# Patient Record
Sex: Male | Born: 1990 | Race: White | Hispanic: No | Marital: Single | Smoking: Current every day smoker
Health system: Southern US, Community
[De-identification: ages and names within clinical notes are randomized; demographics above are authoritative.]

## PROBLEM LIST (undated history)

## (undated) DIAGNOSIS — R569 Unspecified convulsions: Secondary | ICD-10-CM

## (undated) HISTORY — PX: SHOULDER SURGERY: SHX246

## (undated) HISTORY — DX: Unspecified convulsions: R56.9

## (undated) HISTORY — PX: WISDOM TOOTH EXTRACTION: SHX21

---

## 2012-10-19 ENCOUNTER — Ambulatory Visit: Payer: Self-pay | Admitting: Sports Medicine

## 2014-11-12 ENCOUNTER — Emergency Department (HOSPITAL_COMMUNITY): Payer: Self-pay

## 2014-11-12 ENCOUNTER — Emergency Department (HOSPITAL_COMMUNITY)
Admission: EM | Admit: 2014-11-12 | Discharge: 2014-11-12 | Disposition: A | Discharge status: Home / Self Care | Payer: Self-pay | Attending: Emergency Medicine | Admitting: Emergency Medicine

## 2014-11-12 ENCOUNTER — Encounter (HOSPITAL_COMMUNITY): Payer: Self-pay | Admitting: Emergency Medicine

## 2014-11-12 DIAGNOSIS — R569 Unspecified convulsions: Secondary | ICD-10-CM | POA: Insufficient documentation

## 2014-11-12 DIAGNOSIS — Z720 Tobacco use: Secondary | ICD-10-CM | POA: Insufficient documentation

## 2014-11-12 DIAGNOSIS — Z79899 Other long term (current) drug therapy: Secondary | ICD-10-CM | POA: Insufficient documentation

## 2014-11-12 LAB — COMPREHENSIVE METABOLIC PANEL
ALT: 24 U/L (ref 0–53)
ANION GAP: 8 (ref 5–15)
AST: 34 U/L (ref 0–37)
Albumin: 4.5 g/dL (ref 3.5–5.2)
Alkaline Phosphatase: 83 U/L (ref 39–117)
BUN: 16 mg/dL (ref 6–23)
CHLORIDE: 103 meq/L (ref 96–112)
CO2: 25 mmol/L (ref 19–32)
Calcium: 8.8 mg/dL (ref 8.4–10.5)
Creatinine, Ser: 1.31 mg/dL (ref 0.50–1.35)
GFR calc Af Amer: 86 mL/min — ABNORMAL LOW (ref >90)
GFR calc non Af Amer: 75 mL/min — ABNORMAL LOW (ref >90)
Glucose, Bld: 100 mg/dL — ABNORMAL HIGH (ref 70–99)
POTASSIUM: 3.7 mmol/L (ref 3.5–5.1)
Sodium: 136 mmol/L (ref 135–145)
Total Bilirubin: 0.5 mg/dL (ref 0.3–1.2)
Total Protein: 6.9 g/dL (ref 6.0–8.3)

## 2014-11-12 LAB — URINALYSIS, ROUTINE W REFLEX MICROSCOPIC
Bilirubin Urine: NEGATIVE
Glucose, UA: NEGATIVE mg/dL
KETONES UR: NEGATIVE mg/dL
LEUKOCYTES UA: NEGATIVE
NITRITE: NEGATIVE
Protein, ur: NEGATIVE mg/dL
SPECIFIC GRAVITY, URINE: 1.02 (ref 1.005–1.030)
UROBILINOGEN UA: 0.2 mg/dL (ref 0.0–1.0)
pH: 6 (ref 5.0–8.0)

## 2014-11-12 LAB — CBC WITH DIFFERENTIAL/PLATELET
BASOS ABS: 0 10*3/uL (ref 0.0–0.1)
BASOS PCT: 0 % (ref 0–1)
Eosinophils Absolute: 0 10*3/uL (ref 0.0–0.7)
Eosinophils Relative: 0 % (ref 0–5)
HCT: 44.7 % (ref 39.0–52.0)
Hemoglobin: 15.8 g/dL (ref 13.0–17.0)
LYMPHS PCT: 8 % — AB (ref 12–46)
Lymphs Abs: 1.2 10*3/uL (ref 0.7–4.0)
MCH: 33 pg (ref 26.0–34.0)
MCHC: 35.3 g/dL (ref 30.0–36.0)
MCV: 93.3 fL (ref 78.0–100.0)
MONO ABS: 1 10*3/uL (ref 0.1–1.0)
MONOS PCT: 6 % (ref 3–12)
NEUTROS ABS: 13.9 10*3/uL — AB (ref 1.7–7.7)
NEUTROS PCT: 86 % — AB (ref 43–77)
Platelets: 252 10*3/uL (ref 150–400)
RBC: 4.79 MIL/uL (ref 4.22–5.81)
RDW: 12.5 % (ref 11.5–15.5)
WBC: 16.2 10*3/uL — ABNORMAL HIGH (ref 4.0–10.5)

## 2014-11-12 LAB — RAPID URINE DRUG SCREEN, HOSP PERFORMED
AMPHETAMINES: NOT DETECTED
Barbiturates: NOT DETECTED
Benzodiazepines: POSITIVE — AB
COCAINE: NOT DETECTED
OPIATES: NOT DETECTED
Tetrahydrocannabinol: NOT DETECTED

## 2014-11-12 LAB — URINE MICROSCOPIC-ADD ON

## 2014-11-12 LAB — ETHANOL

## 2014-11-12 MED ORDER — SODIUM CHLORIDE 0.9 % IV BOLUS (SEPSIS)
1000.0000 mL | Freq: Once | INTRAVENOUS | Status: AC
  Administered 2014-11-12: 1000 mL via INTRAVENOUS

## 2014-11-12 NOTE — ED Notes (Signed)
Pt states he was told that he was filling out some paper work when he had a seizure. Pt reports he has never had a seizure before. Seizure witnessed by pts brother. Pt states lately he has been stressed and cutting carbs. Pt reports he fell off bar stool and hit his head on the floor while seizing (no injuries to head). Pt states he bit his tongue and is having lower back pain. Pt is alert and oriented.

## 2014-11-12 NOTE — Discharge Instructions (Signed)
You cannot drive until cleared by a neurologist. Seizure, Adult A seizure is abnormal electrical activity in the brain. Seizures usually last from 30 seconds to 2 minutes. There are various types of seizures. Before a seizure, you may have a warning sensation (aura) that a seizure is about to occur. An aura may include the following symptoms:   Fear or anxiety.  Nausea.  Feeling like the room is spinning (vertigo).  Vision changes, such as seeing flashing lights or spots. Common symptoms during a seizure include:  A change in attention or behavior (altered mental status).  Convulsions with rhythmic jerking movements.  Drooling.  Rapid eye movements.  Grunting.  Loss of bladder and bowel control.  Bitter taste in the mouth.  Tongue biting. After a seizure, you may feel confused and sleepy. You may also have an injury resulting from convulsions during the seizure. HOME CARE INSTRUCTIONS   If you are given medicines, take them exactly as prescribed by your health care provider.  Keep all follow-up appointments as directed by your health care provider.  Do not swim or drive or engage in risky activity during which a seizure could cause further injury to you or others until your health care provider says it is OK.  Get adequate rest.  Teach friends and family what to do if you have a seizure. They should:  Lay you on the ground to prevent a fall.  Put a cushion under your head.  Loosen any tight clothing around your neck.  Turn you on your side. If vomiting occurs, this helps keep your airway clear.  Stay with you until you recover.  Know whether or not you need emergency care. SEEK IMMEDIATE MEDICAL CARE IF:  The seizure lasts longer than 5 minutes.  The seizure is severe or you do not wake up immediately after the seizure.  You have an altered mental status after the seizure.  You are having more frequent or worsening seizures. Someone should drive you to  the emergency department or call local emergency services (911 in [place] MAKE SURE YOU:  Understand these instructions.  Will watch your condition.  Will get help right away if you are not doing well or get worse. Document Released: 10/14/2000 Document Revised: 08/07/2013 Document Reviewed: 05/29/2013 [facility] Patient Information 2015 [employer], [place]. This information is not intended to replace advice given to you by your health care provider. Make sure you discuss any questions you have with your health care provider.

## 2014-11-12 NOTE — ED Provider Notes (Signed)
CSN: [id number]     Arrival date & time 11/12/14  1956 History   First MD Initiated Contact with Patient 11/12/14 2118     Chief Complaint  Patient presents with  . Seizures     (Consider location/radiation/quality/duration/timing/severity/associated sxs/prior Treatment) Patient is a 24 y.o. male presenting with seizures.  Seizures Seizure activity on arrival: no   Seizure type:  Grand mal Preceding symptoms: no sensation of an aura present   Initial focality:  None Episode characteristics: abnormal movements, generalized shaking, limpness, tongue biting and unresponsiveness   Episode characteristics: no incontinence   Postictal symptoms: confusion and somnolence   Return to baseline: yes   Severity:  Moderate Duration:  30 seconds Timing:  Once Number of seizures this episode:  1 Progression:  Resolved Context comment:  Recent low carb diet, lots of exercise   History reviewed. No pertinent past medical history. History reviewed. No pertinent past surgical history. History reviewed. No pertinent family history. History  Substance Use Topics  . Smoking status: Current Every Day Smoker  . Smokeless tobacco: Not on file  . Alcohol Use: Yes     Comment: once or twice a week    Review of Systems  Neurological: Positive for seizures.  All other systems reviewed and are negative.     Allergies  Review of patient's allergies indicates no known allergies.  Home Medications   Prior to Admission medications   Medication Sig Start Date End Date Taking? Authorizing Provider  Multiple Vitamin (MULTIVITAMIN WITH MINERALS) TABS tablet Take 1 tablet by mouth daily.   Yes Historical Provider, MD   BP 112/62 mmHg  Pulse 77  Temp(Src) 97.7 F (36.5 C) (Oral)  Resp 14  Ht  (1.676 m)  Wt 185 lb (83.915 kg)  BMI 29.87 kg/m2  SpO2 99% Physical Exam  Constitutional: He is oriented to person, place, and time. He appears well-developed and well-nourished.  HENT:  Head:  Normocephalic and atraumatic.  Eyes: Conjunctivae and EOM are normal.  Neck: Normal range of motion. Neck supple.  Cardiovascular: Normal rate, regular rhythm and normal heart sounds.   Pulmonary/Chest: Effort normal and breath sounds normal. No respiratory distress.  Abdominal: He exhibits no distension. There is no tenderness. There is no rebound and no guarding.  Musculoskeletal: Normal range of motion.  Neurological: He is alert and oriented to person, place, and time. He has normal strength and normal reflexes. No cranial nerve deficit or sensory deficit. GCS eye subscore is 4. GCS verbal subscore is 5. GCS motor subscore is 6.  Skin: Skin is warm and dry.  Vitals reviewed.   ED Course  Procedures (including critical care time) Labs Review Labs Reviewed  COMPREHENSIVE METABOLIC PANEL - Abnormal; Notable for the following:    Glucose, Bld 100 (*)    GFR calc non Af Amer 75 (*)    GFR calc Af Amer 86 (*)    All other components within normal limits  URINE RAPID DRUG SCREEN (HOSP PERFORMED) - Abnormal; Notable for the following:    Benzodiazepines POSITIVE (*)    All other components within normal limits  CBC WITH DIFFERENTIAL - Abnormal; Notable for the following:    WBC 16.2 (*)    Neutrophils Relative % 86 (*)    Neutro Abs 13.9 (*)    Lymphocytes Relative 8 (*)    All other components within normal limits  URINALYSIS, ROUTINE W REFLEX MICROSCOPIC - Abnormal; Notable for the following:    Hgb urine dipstick TRACE (*)  All other components within normal limits  URINE MICROSCOPIC-ADD ON - Abnormal; Notable for the following:    Casts GRANULAR CAST (*)    All other components within normal limits  ETHANOL    Imaging Review Dg Chest 2 View  11/12/2014   CLINICAL DATA:  Seizure this evening, upper and lower back pain  EXAM: CHEST  2 VIEW  COMPARISON:  None  FINDINGS: Upper normal size of cardiac silhouette for technique.  Mediastinal contours and pulmonary vascularity  normal.  Lungs clear.  No pleural effusion or pneumothorax.  Prior resection versus resorption of distal RIGHT clavicle.  Osseous structures otherwise unremarkable.  IMPRESSION: No acute abnormalities.   Electronically Signed   By: Ulyses SouthwardMark  Boles M.D.   On: 11/12/2014 22:29   Ct Head Wo Contrast  11/12/2014   CLINICAL DATA:  24 year old male with new onset seizure.  EXAM: CT HEAD WITHOUT CONTRAST  TECHNIQUE: Contiguous axial images were obtained from the base of the skull through the vertex without intravenous contrast.  COMPARISON:  None.  FINDINGS: No intracranial hemorrhage, mass effect, or midline shift. No hydrocephalus. The basilar cisterns are patent. No evidence of territorial infarct. No intracranial fluid collection. Calvarium is intact. Included paranasal sinuses and mastoid air cells are well aerated.  IMPRESSION: No acute intracranial abnormality.   Electronically Signed   By: Rubye OaksMelanie  Ehinger M.D.   On: 11/12/2014 22:14     EKG Interpretation   Date/Time:  Wednesday November 12 2014 21:36:18 EST Ventricular Rate:  74 PR Interval:  140 QRS Duration: 95 QT Interval:  391 QTC Calculation: 434 R Axis:   82 Text Interpretation:  Sinus arrhythmia No old tracing to compare Confirmed  by Mirian MoGentry, Matthew [phone]([id number]) on 11/12/2014 11:00:15 PM      MDM   Final diagnoses:  Seizure    24 y.o. male without pertinent PMH presents with  New-onset seizure as above. No  History of similar episodes. Patient states that he is on a low-carb diet. No recent illness. Patient fell from a stool, has midline tenderness diffusely of spine however states that is very mild and discussed radiography which the patient refused. Neuro exam unremarkable.  WU as above obtained and without acute abnormality. Likely first-time seizure. Patient given standard seizure return precautions. Will follow-up with neurology. Discharged home in stable condition..    I have reviewed all laboratory and imaging studies if ordered  as above  1. Seizure          Mirian MoMatthew Gentry, MD 11/13/14 1806

## 2014-11-12 NOTE — ED Notes (Signed)
EKG given to EDP,Gentry,MD., for review. 

## 2014-11-14 ENCOUNTER — Encounter: Payer: Self-pay | Admitting: Neurology

## 2014-11-14 ENCOUNTER — Ambulatory Visit (INDEPENDENT_AMBULATORY_CARE_PROVIDER_SITE_OTHER): Payer: Self-pay | Admitting: Neurology

## 2014-11-14 VITALS — BP 103/53 | HR 63 | Ht 66.0 in | Wt 188.0 lb

## 2014-11-14 DIAGNOSIS — R569 Unspecified convulsions: Secondary | ICD-10-CM

## 2014-11-14 NOTE — Patient Instructions (Signed)
Overall you are doing fairly well but I do want to suggest a few things today:   Remember to drink plenty of fluid, eat healthy meals and do not skip any meals. Try to eat protein with a every meal and eat a healthy snack such as fruit or nuts in between meals. Try to keep a regular sleep-wake schedule and try to exercise daily, particularly in the form of walking, 20-30 minutes a day, if you can.   As far as your medications are concerned, I would like to suggest: Will not start anti-epileptics at this time  As far as diagnostic testing: MRI of the brain, lab tests today, EEG  I would like to see you back in 3 months, sooner if we need to. Please call us with any interim questions, concerns, problems, updates or refill requests.   Please also call us for any test results so we can go over those with you on the phone.  My clinical assistant and will answer any of your questions and relay your messages to me and also relay most of my messages to you.   Our phone number is [phone]. We also have an after hours call service for urgent matters and there is a physician on-call for urgent questions. For any emergencies you know to call 911 or go to the nearest emergency room

## 2014-11-14 NOTE — Progress Notes (Addendum)
[facility]    Provider:  Dr Lucia Gaskins Referring Provider: Mirian Mo, MD Primary Care Physician:  No primary care provider on file.  CC:  Seizure  HPI:  Steve Whitney is a 24 y.o. male here as a referral from Dr. Littie Deeds for Seizures  Patient is in good health. 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 He was told he had a seizure. He couldn't believe it. He bit his tongue. No urination. He had been strictly dieting and he was drinking lots of coffee al day. He is taking stimulants for working out, been taking them for 5 years, hadn't increased them, 2 scoops of turntub(has ephedrine) before workout and takes some other supplements. Brother said patient fell to the ground, "looked like a fish flopping out of water", tonic-clonic movements, lasted 30 seconds, was confused 5-10 minutes after. Sometimes takes xanax for depression or anxiety. He has been on strict diet without carbs. Lots of stress in his life. No personal or family history of seizures, head trauma, meningitis or other infections. No recent illnesses.   Reviewed notes, labs and imaging from outside physicians, which showed: he was seen in the ED on January 13th, seizure witnesed by brother, story given to ED consistent with history today. Neurologic exam was normal in the ED, patient refused images for low back pain (fell from a stool), given standard seizure precautions. etoh negative, cmp unremarkable, cbc with elevated wbcs 86% neutrophils, drug screen positive for benzodiazepines.   Ct showed No acute intracranial abnormalities including mass lesion or mass effect, hydrocephalus, extra-axial fluid collection, midline shift, hemorrhage, or acute infarction, large ischemic events (personally reviewed images)  Review of Systems: Patient complains of symptoms per HPI as well as the following symptoms denies CP, SOB. Pertinent negatives per HPI. All others  negative.   History   Social History  . Marital Status: Single    Spouse Name: N/A    Number of Children: 1  . Years of Education: 12+   Occupational History  . Not on file.   Social History Main Topics  . Smoking status: Current Every Day Smoker -- 0.50 packs/day    Types: Cigarettes  . Smokeless tobacco: Never Used  . Alcohol Use: 0.0 oz/week    0 Not specified per week     Comment: once or twice a week  . Drug Use: No  . Sexual Activity: Not on file   Other Topics Concern  . Not on file   Social History Narrative   Patient lives at home alone.    Patient has 1 child   Patient has an Tax adviser degree   Patient works at [employer] History  Problem Relation Age of Onset  . Lung cancer    . Stroke    . Seizures Neg Hx     Past Medical History  Diagnosis Date  . Seizures     Past Surgical History  Procedure Laterality Date  . Wisdom tooth extraction    . Shoulder surgery Right     Current Outpatient Prescriptions  Medication Sig Dispense Refill  . Multiple Vitamin (MULTIVITAMIN WITH MINERALS) TABS tablet Take 1 tablet by mouth daily.     No current facility-administered medications for this visit.    Allergies as of 11/14/2014  . (No Known Allergies)    Vitals: BP 103/53 mmHg  Pulse 63  Ht  (1.676 m)  Wt 188 lb (85.276 kg)  BMI 30.36 kg/m2 Last Weight:  Wt Readings from Last 1 Encounters:  11/14/14 188 lb (85.276 kg)   Last Height:   Ht Readings from Last 1 Encounters:  11/14/14 5\' 6"  (1.676 m)    Physical exam: Exam: Gen: NAD, conversant, well nourised, muscular                     CV: RRR, no MRG. No Carotid Bruits. No peripheral edema, warm, nontender Eyes: Conjunctivae clear without exudates or hemorrhage  Neuro: Detailed Neurologic Exam  Speech:    Speech is normal; fluent and spontaneous with normal comprehension.  Cognition:    The patient is oriented to person, place, and time;     recent and  remote memory intact;     language fluent;     normal attention, concentration,     fund of knowledge Cranial Nerves:    The pupils are equal, round, and reactive to light. The fundi are normal and spontaneous venous pulsations are present. Visual fields are full to finger confrontation. Extraocular movements are intact. Trigeminal sensation is intact and the muscles of mastication are normal. The face is symmetric. The palate elevates in the midline. Hearing intact. Voice is normal. Shoulder shrug is normal. The tongue has normal motion without fasciculations.   Coordination:    Normal finger to nose and heel to shin. Normal rapid alternating movements.   Gait:    Heel-toe and tandem gait are normal.   Motor Observation:    No asymmetry, no atrophy, and no involuntary movements noted. Tone:    Normal muscle tone.    Posture:    Posture is normal. normal erect    Strength:    Strength is V/V in the upper and lower limbs.      Sensation: intact     Reflex Exam:  DTR's:    Deep tendon reflexes in the upper and lower extremities are normal bilaterally.   Toes:    The toes are downgoing bilaterally.   Clonus:    Clonus is absent.      Assessment/Plan:  24 year old previously healthy male who is here for evaluation with likely a provoked seizure. Neuro exam normal. Will order EEG, MRI of the brain w/wo contrast, labs. Will hold off on AEDs for now. No driving for 6 months to a year seizure free or per locality of residence, do not do anything that can hurt yourself or others should you have a seizure such as bathing or swimming alone, call 911 if another seizure occurs and follow up in the office. Good sleep hygiene, good diet (discontinue the strict no-carb diet for a more well-balanced menu), stop OTC supplements slowly.   Naomie DeanAntonia Starlett Pehrson, MD  [facility] [address] [place], [place] [id number]  Phone [phone] Fax [phone]  A total  of 40 minutes was spent in with this patient. Over half this time was spent on counseling patient on the diagnosis and different therapeutic options available.

## 2014-11-15 LAB — CBC
HCT: 45.2 % (ref 37.5–51.0)
HEMOGLOBIN: 15.6 g/dL (ref 12.6–17.7)
MCH: 32.6 pg (ref 26.6–33.0)
MCHC: 34.5 g/dL (ref 31.5–35.7)
MCV: 95 fL (ref 79–97)
PLATELETS: 275 10*3/uL (ref 150–379)
RBC: 4.78 x10E6/uL (ref 4.14–5.80)
RDW: 13.8 % (ref 12.3–15.4)
WBC: 9.5 10*3/uL (ref 3.4–10.8)

## 2014-11-15 LAB — COMPREHENSIVE METABOLIC PANEL
ALBUMIN: 4.4 g/dL (ref 3.5–5.5)
ALK PHOS: 80 IU/L (ref 39–117)
ALT: 27 IU/L (ref 0–44)
AST: 45 IU/L — ABNORMAL HIGH (ref 0–40)
Albumin/Globulin Ratio: 2 (ref 1.1–2.5)
BUN / CREAT RATIO: 11 (ref 8–19)
BUN: 13 mg/dL (ref 6–20)
CHLORIDE: 101 mmol/L (ref 97–108)
CO2: 23 mmol/L (ref 18–29)
CREATININE: 1.21 mg/dL (ref 0.76–1.27)
Calcium: 9.5 mg/dL (ref 8.7–10.2)
GFR calc Af Amer: 96 mL/min/{1.73_m2} (ref >59)
GFR calc non Af Amer: 83 mL/min/{1.73_m2} (ref >59)
Globulin, Total: 2.2 g/dL (ref 1.5–4.5)
Glucose: 89 mg/dL (ref 65–99)
Potassium: 4.4 mmol/L (ref 3.5–5.2)
SODIUM: 140 mmol/L (ref 134–144)
Total Bilirubin: 0.3 mg/dL (ref 0.0–1.2)
Total Protein: 6.6 g/dL (ref 6.0–8.5)

## 2014-11-15 LAB — PHOSPHORUS: PHOSPHORUS: 2.5 mg/dL (ref 2.5–4.5)

## 2014-11-15 LAB — MAGNESIUM: MAGNESIUM: 2.1 mg/dL (ref 1.6–2.3)

## 2014-11-28 ENCOUNTER — Other Ambulatory Visit: Payer: Self-pay | Admitting: Radiology

## 2014-12-03 ENCOUNTER — Ambulatory Visit (INDEPENDENT_AMBULATORY_CARE_PROVIDER_SITE_OTHER): Payer: BLUE CROSS/BLUE SHIELD | Admitting: Neurology

## 2014-12-03 DIAGNOSIS — R569 Unspecified convulsions: Secondary | ICD-10-CM

## 2014-12-03 NOTE — Procedures (Signed)
    History:   Steve PyleWilliam Whitney is a 24 year old a sheet with a witnessed seizure event on 04/13/2015. The patient had taken ephedrine prior to working out. The patient had a generalized seizure event, with tongue biting. The patient is being evaluated for this episode.  This is a routine EEG. No skull defects are noted. Medications include multivitamins.  EEG classification: Normal awake   Description of the recording: The background rhythms of this recording consists of a fairly well modulated medium amplitude alpha rhythm of 8 Hz that is reactive to eye opening and closure. As the record progresses, the patient appears to remain in the waking state throughout the recording. Photic stimulation was performed, resulting in a bilateral and symmetric photic driving response. Hyperventilation was also performed, resulting in a minimal buildup of the background rhythm activities without significant slowing seen. The patient never enters stage II sleep. At no time during the recording does there appear to be evidence of spike or spike wave discharges or evidence of focal slowing. EKG monitor shows no evidence of cardiac rhythm abnormalities with a heart rate of 78.  Impression: This is a normal EEG recording in the waking state. No evidence of ictal or interictal discharges are seen.

## 2014-12-05 ENCOUNTER — Telehealth: Payer: Self-pay | Admitting: *Deleted

## 2014-12-05 NOTE — Telephone Encounter (Signed)
-----   Message from Antonia B Ahern, MD sent at 12/03/2014  6:06 PM EST ----- Please let patient know his EEG was normal 

## 2014-12-05 NOTE — Telephone Encounter (Signed)
Tried calling patient about EEG results. Mailbox was full and unable to leave a message.

## 2014-12-09 NOTE — Telephone Encounter (Signed)
Talked with patient about normal EEG results. Patient verbalized understanding.  

## 2014-12-09 NOTE — Telephone Encounter (Signed)
-----   Message from Anson FretAntonia B Ahern, MD sent at 12/03/2014  6:06 PM EST ----- Please let patient know his EEG was normal

## 2014-12-23 ENCOUNTER — Ambulatory Visit
Admission: RE | Admit: 2014-12-23 | Discharge: 2014-12-23 | Disposition: A | Discharge status: Home / Self Care | Payer: BLUE CROSS/BLUE SHIELD | Source: Ambulatory Visit | Attending: Neurology | Admitting: Neurology

## 2014-12-23 DIAGNOSIS — R569 Unspecified convulsions: Secondary | ICD-10-CM

## 2014-12-23 MED ORDER — GADOBENATE DIMEGLUMINE 529 MG/ML IV SOLN
17.0000 mL | Freq: Once | INTRAVENOUS | Status: AC | PRN
  Administered 2014-12-23: 17 mL via INTRAVENOUS

## 2014-12-25 ENCOUNTER — Telehealth: Payer: Self-pay | Admitting: *Deleted

## 2014-12-25 NOTE — Telephone Encounter (Signed)
Talked with patient about normal MRI results. Patient verbalized understanding.

## 2015-02-13 ENCOUNTER — Ambulatory Visit: Payer: Self-pay | Admitting: Neurology

## 2015-02-16 ENCOUNTER — Encounter: Payer: Self-pay | Admitting: Neurology

## 2015-03-17 ENCOUNTER — Encounter: Payer: Self-pay | Admitting: Neurology

## 2015-03-17 ENCOUNTER — Ambulatory Visit (INDEPENDENT_AMBULATORY_CARE_PROVIDER_SITE_OTHER): Payer: BLUE CROSS/BLUE SHIELD | Admitting: Neurology

## 2015-03-17 VITALS — BP 130/72 | HR 85 | Ht 66.0 in | Wt 186.5 lb

## 2015-03-17 DIAGNOSIS — Z9119 Patient's noncompliance with other medical treatment and regimen: Secondary | ICD-10-CM

## 2015-03-17 DIAGNOSIS — R569 Unspecified convulsions: Secondary | ICD-10-CM

## 2015-03-17 DIAGNOSIS — Z91199 Patient's noncompliance with other medical treatment and regimen due to unspecified reason: Secondary | ICD-10-CM

## 2015-03-17 DIAGNOSIS — F411 Generalized anxiety disorder: Secondary | ICD-10-CM | POA: Insufficient documentation

## 2015-03-17 DIAGNOSIS — F329 Major depressive disorder, single episode, unspecified: Secondary | ICD-10-CM

## 2015-03-17 DIAGNOSIS — F32A Depression, unspecified: Secondary | ICD-10-CM | POA: Insufficient documentation

## 2015-03-17 MED ORDER — LAMOTRIGINE 25 MG PO TABS: 100.0000 mg | ORAL_TABLET | Freq: Two times a day (BID) | ORAL | Status: DC

## 2015-03-17 MED ORDER — LAMOTRIGINE 25 MG PO TABS: 100.0000 mg | ORAL_TABLET | Freq: Two times a day (BID) | ORAL | Status: AC

## 2015-03-17 NOTE — Progress Notes (Signed)
[facility]    Provider:  Dr Lucia Gaskins Referring Provider: No ref. provider found Primary Care Physician:  No primary care provider on file.   Interval update: He is still taking supplements, DMAE and Methylxanthines. Sunday at 4pm had a bad headache. Blurry vision. He was hungry. The next thing he remembers the EMS is there are they said he had another seizure. His little brother witnessed it, he was shaking all over. Bit his tongue.   Discussed MRI of the brain which was normal. Discussed EEG which was normal. At last appointment, advised patient to stop taking supplements as they contain ingredients that may cause seizures. The above 2 substances have been known to cause seizures. Also discussed not driving for 6 months until seizure free. Patient reports he did not know he was supposed to stop driving, but this was discussed at the last appointment. Again stressed no driving until seizure free for 6 months or per local or state regulations of residence. Had a long discussion with patient that driving poses a risk not only to himself but 2 other driver's on the road and that patient could be held criminally responsible should he drive a motor vehicle and hurt someone.  Initial visit 11/14/2014: Steve Whitney is a 24 y.o. male here as a referral from Dr. Littie Deeds for Seizures. He had another seizure. 4pm this past Sunday, started with a headache. His eyes blurred. Everything was fine, no dieting like before, sleep was good, no new medications.   Patient is in good health. 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 He was told he had a seizure. He couldn't believe it. He bit his tongue. No urination. He had been strictly dieting and he was drinking lots of coffee al day. He is taking stimulants for working out, been taking them for 5 years, hadn't increased them, 2 scoops of turntub(has ephedrine) before workout and takes some other  supplements. Brother said patient fell to the ground, "looked like a fish flopping out of water", tonic-clonic movements, lasted 30 seconds, was confused 5-10 minutes after. Sometimes takes xanax for depression or anxiety. He has been on strict diet without carbs. Lots of stress in his life. No personal or family history of seizures, head trauma, meningitis or other infections. No recent illnesses.   Reviewed notes, labs and imaging from outside physicians, which showed: he was seen in the ED on January 13th, seizure witnesed by brother, story given to ED consistent with history today. Neurologic exam was normal in the ED, patient refused images for low back pain (fell from a stool), given standard seizure precautions. etoh negative, cmp unremarkable, cbc with elevated wbcs 86% neutrophils, drug screen positive for benzodiazepines.   Ct showed No acute intracranial abnormalities including mass lesion or mass effect, hydrocephalus, extra-axial fluid collection, midline shift, hemorrhage, or acute infarction, large ischemic events (personally reviewed images)  Review of Systems: Patient complains of symptoms per HPI as well as the following symptoms: Activity change, appetite change, headaches, seizure, depression, anxiety. Pertinent negatives per HPI. All others negative.   History   Social History  . Marital Status: Single    Spouse Name: N/A  . Number of Children: 1  . Years of Education: 12+   Occupational History  . Not on file.   Social History Main Topics  . Smoking status: Current Every Day Smoker -- 0.50 packs/day    Types: Cigarettes  . Smokeless tobacco: Never  Used  . Alcohol Use: 0.0 oz/week    0 Standard drinks or equivalent per week     Comment: once or twice a week  . Drug Use: No  . Sexual Activity: Not on file   Other Topics Concern  . Not on file   Social History Narrative   Patient lives at home alone.    Patient has 1 child   Patient has an Associate degree    Patient works at [employer]    Caffeine use: Drinks sweet tea (4 drinks per day)   1 cup coffee per day       Family History  Problem Relation Age of Onset  . Lung cancer    . Stroke    . Seizures Neg Hx     Past Medical History  Diagnosis Date  . Seizures     Past Surgical History  Procedure Laterality Date  . Wisdom tooth extraction    . Shoulder surgery Right     Current Outpatient Prescriptions  Medication Sig Dispense Refill  . ALPRAZolam (XANAX) 0.25 MG tablet Take 0.25 mg by mouth daily.  0  . Multiple Vitamin (MULTIVITAMIN WITH MINERALS) TABS tablet Take 1 tablet by mouth daily.    . traZODone (DESYREL) 50 MG tablet Take 50 mg by mouth daily.  0   No current facility-administered medications for this visit.    Allergies as of 03/17/2015  . (No Known Allergies)    Vitals: BP 130/72 mmHg  Pulse 85  Ht 5\' 6"  (1.676 m)  Wt 186 lb 8 oz (84.596 kg)  BMI 30.12 kg/m2 Last Weight:  Wt Readings from Last 1 Encounters:  03/17/15 186 lb 8 oz (84.596 kg)   Last Height:   Ht Readings from Last 1 Encounters:  03/17/15 5\' 6"  (1.676 m)   Physical exam: Exam: Gen: NAD, conversant, well nourised, obese, well groomed                     CV: RRR, no MRG. No Carotid Bruits. No peripheral edema, warm, nontender Eyes: Conjunctivae clear without exudates or hemorrhage  Neuro: Detailed Neurologic Exam  Speech:    Speech is normal; fluent and spontaneous with normal comprehension.  Cognition:    The patient is oriented to person, place, and time;     recent and remote memory intact;     language fluent;     normal attention, concentration,     fund of knowledge Cranial Nerves:    The pupils are equal, round, and reactive to light. The fundi are normal and spontaneous venous pulsations are present. Visual fields are full to finger confrontation. Extraocular movements are intact. Trigeminal sensation is intact and the muscles of mastication are normal. The face  is symmetric. The palate elevates in the midline. Hearing intact. Voice is normal. Shoulder shrug is normal. The tongue has normal motion without fasciculations.   Coordination:    Normal finger to nose and heel to shin. Normal rapid alternating movements.         Assessment/Plan: 24 year old previously healthy male who is here for follow-up with repeat seizure. Neuro exam normal. He is still taking supplements, including DMAE and Methylxanthines despite warnings that these powders that he is taking for body building may cause seizures, it is unknown.   Discussed MRI of the brain which was normal. Discussed EEG which was normal.   discussed not driving for 6 months until seizure free. Patient reports he did not  know he was supposed to stop driving, but this was discussed at the last appointment. Again stressed no driving until seizure free for 6 months or per local or state regulations of residence. Had a long discussion with patient that driving poses a risk not only to himself but 2 other driver's on the road and that patient could be held criminally responsible should he drive a motor vehicle and hurt someone.  We'll start Lamictal for seizures. This may also help with his mood disorder. Discussed side effects especially the need to stop and call the office should he develop a rash. He was also given Lamictal patient information as printed from up-to-date database. Follow-up in 3 months.   Naomie DeanAntonia Breanna Mcdaniel, MD  [facility] [address] [place], [place] [id number]  Phone [phone] Fax [phone]  A total of 30 minutes was spent face-to-face with this patient. Over half this time was spent on counseling patient on the seizure diagnosis and different diagnostic and therapeutic options available.

## 2015-03-17 NOTE — Patient Instructions (Signed)
Overall you are doing fairly well but I do want to suggest a few things today:   Remember to drink plenty of fluid, eat healthy meals and do not skip any meals. Try to eat protein with a every meal and eat a healthy snack such as fruit or nuts in between meals. Try to keep a regular sleep-wake schedule and try to exercise daily, particularly in the form of walking, 20-30 minutes a day, if you can.   As far as your medications are concerned, I would like to suggest:  Week 1: Take 25mg  (one pill) by mouth at night every day Week 2: Take 25mg  (one pill) by mouth in the morning and night every day Week 3: Take 50mg  (two pills) by mouth every night and 25mg  every morning Week 4: Take 50mg  (two pills) by mouth every night and every morning Week 5: Take 75mg  (three pills) every night and take 50 mg (two pills) every morning Week 6: Take 75mg  (three pills) every night and morning Week 7: Take 100mg  (four pills) every night and take 75mg  (three pills) every morning Week 8: Take 100mg  (four pills) every night and morning  I would like to see you back in 3 months, sooner if we need to. Please call us with any interim questions, concerns, problems, updates or refill requests.   Please also call us for any test results so we can go over those with you on the phone.  My clinical assistant and will answer any of your questions and relay your messages to me and also relay most of my messages to you.   Our phone number is [phone]. We also have an after hours call service for urgent matters and there is a physician on-call for urgent questions. For any emergencies you know to call 911 or go to the nearest emergency room

## 2015-05-20 ENCOUNTER — Telehealth: Payer: Self-pay | Admitting: *Deleted

## 2015-05-20 NOTE — Telephone Encounter (Signed)
I called pt and LMVM for him that Dr. Lucia GaskinsAhern had a change in her schedule.   Wanted to keep in on same date and time and see NP, CM.  Will see Dr.Ahern then next appt after that.  Wanted to get confirmation first prior to making change.  Please call back.

## 2015-05-27 ENCOUNTER — Encounter: Payer: Self-pay | Admitting: *Deleted

## 2015-05-27 NOTE — Telephone Encounter (Signed)
I mailed letter to pt about changing his appt to NP/ Darrol Angel, same day and time.  06-17-15 1400.  Pt had not returned multiple phone calls.  He was told to call if this does not work for him.

## 2015-06-17 ENCOUNTER — Ambulatory Visit: Payer: BLUE CROSS/BLUE SHIELD | Admitting: Neurology

## 2015-06-17 ENCOUNTER — Ambulatory Visit: Payer: Self-pay | Admitting: Nurse Practitioner

## 2015-06-18 ENCOUNTER — Encounter: Payer: Self-pay | Admitting: Nurse Practitioner

## 2016-01-14 IMAGING — CR DG CHEST 2V
2 series · 2 of 2 positions shown · non-contrast
Comparison: None

CLINICAL DATA: Seizure this evening, upper and lower back pain

EXAM:
CHEST  2 VIEW

[w chest lat]
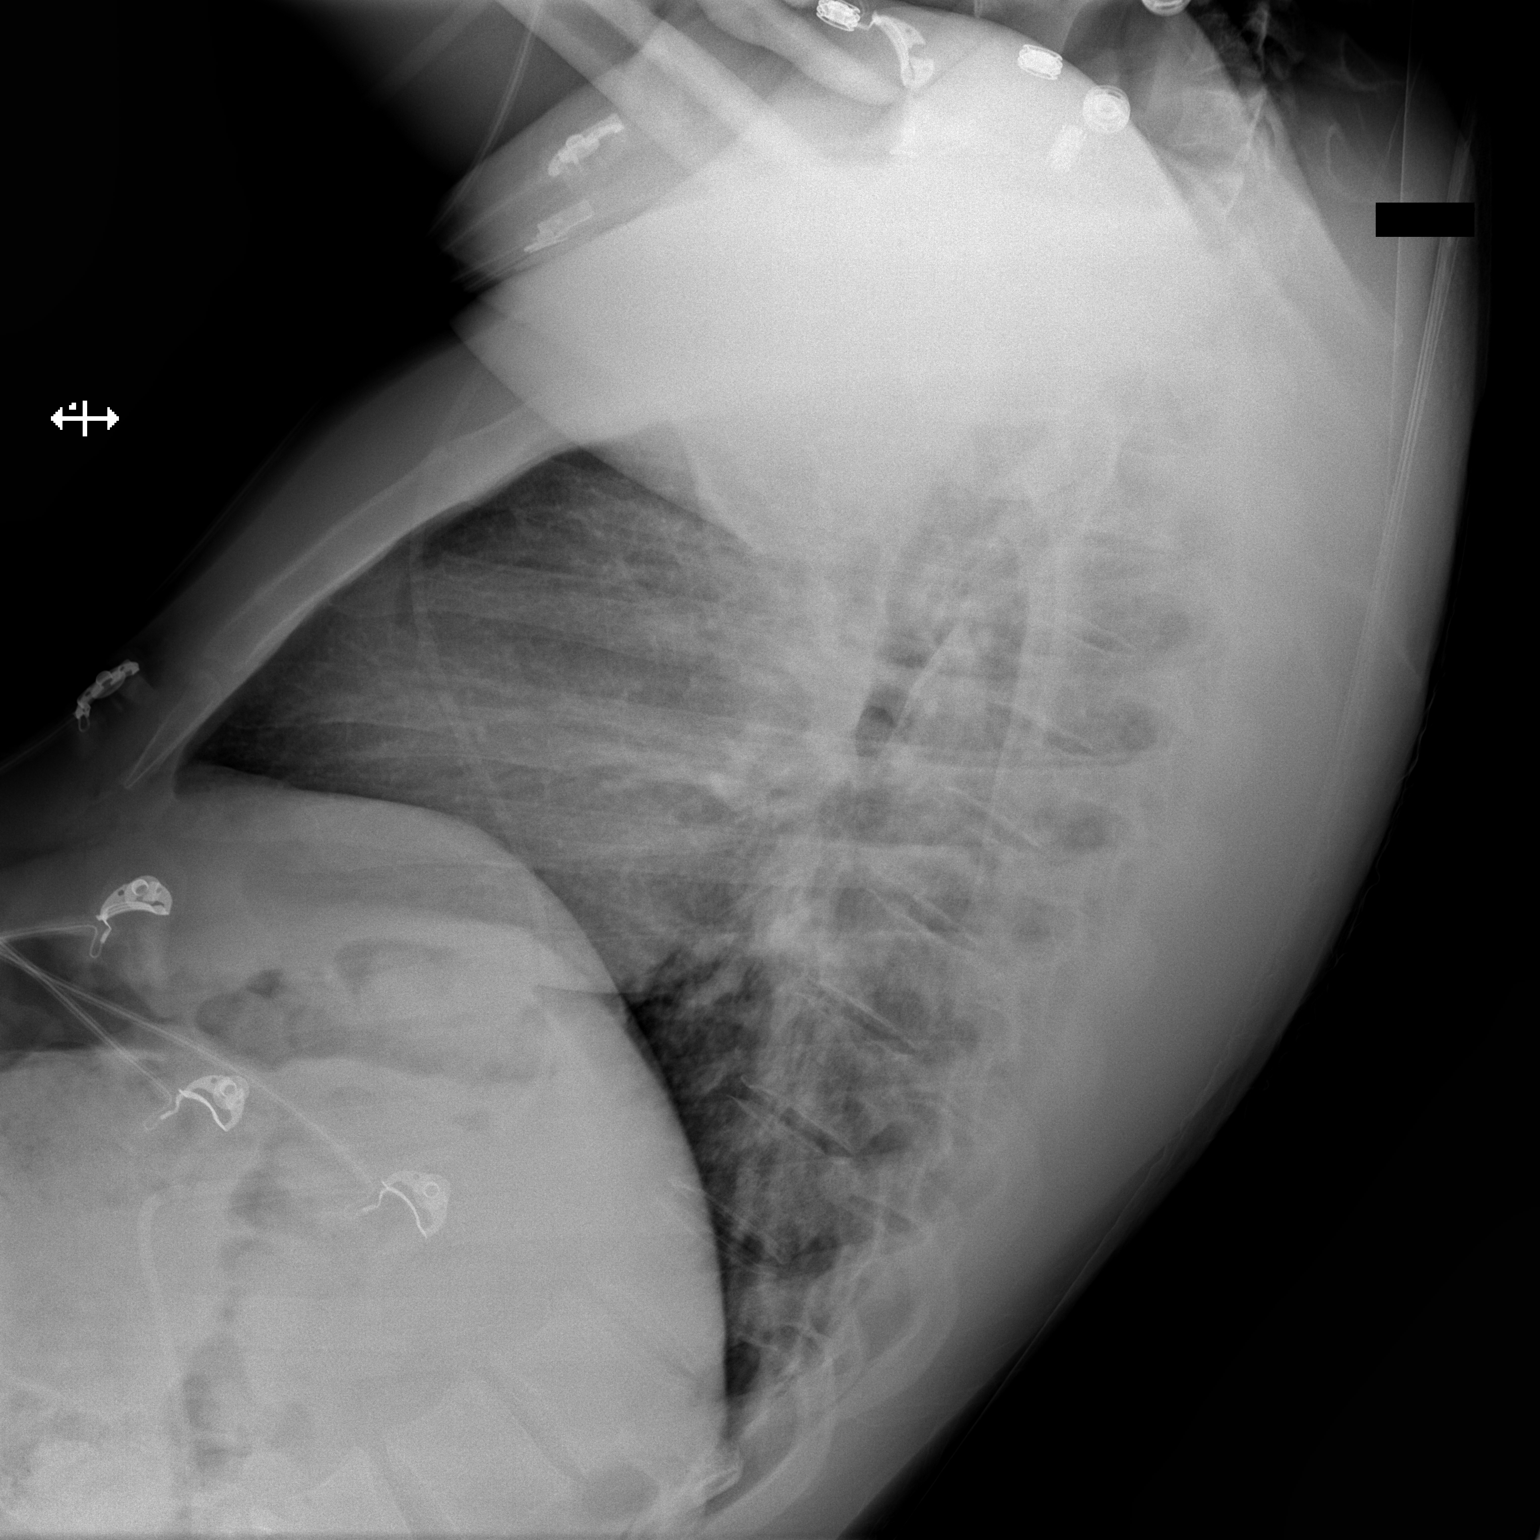

[x chest ap]
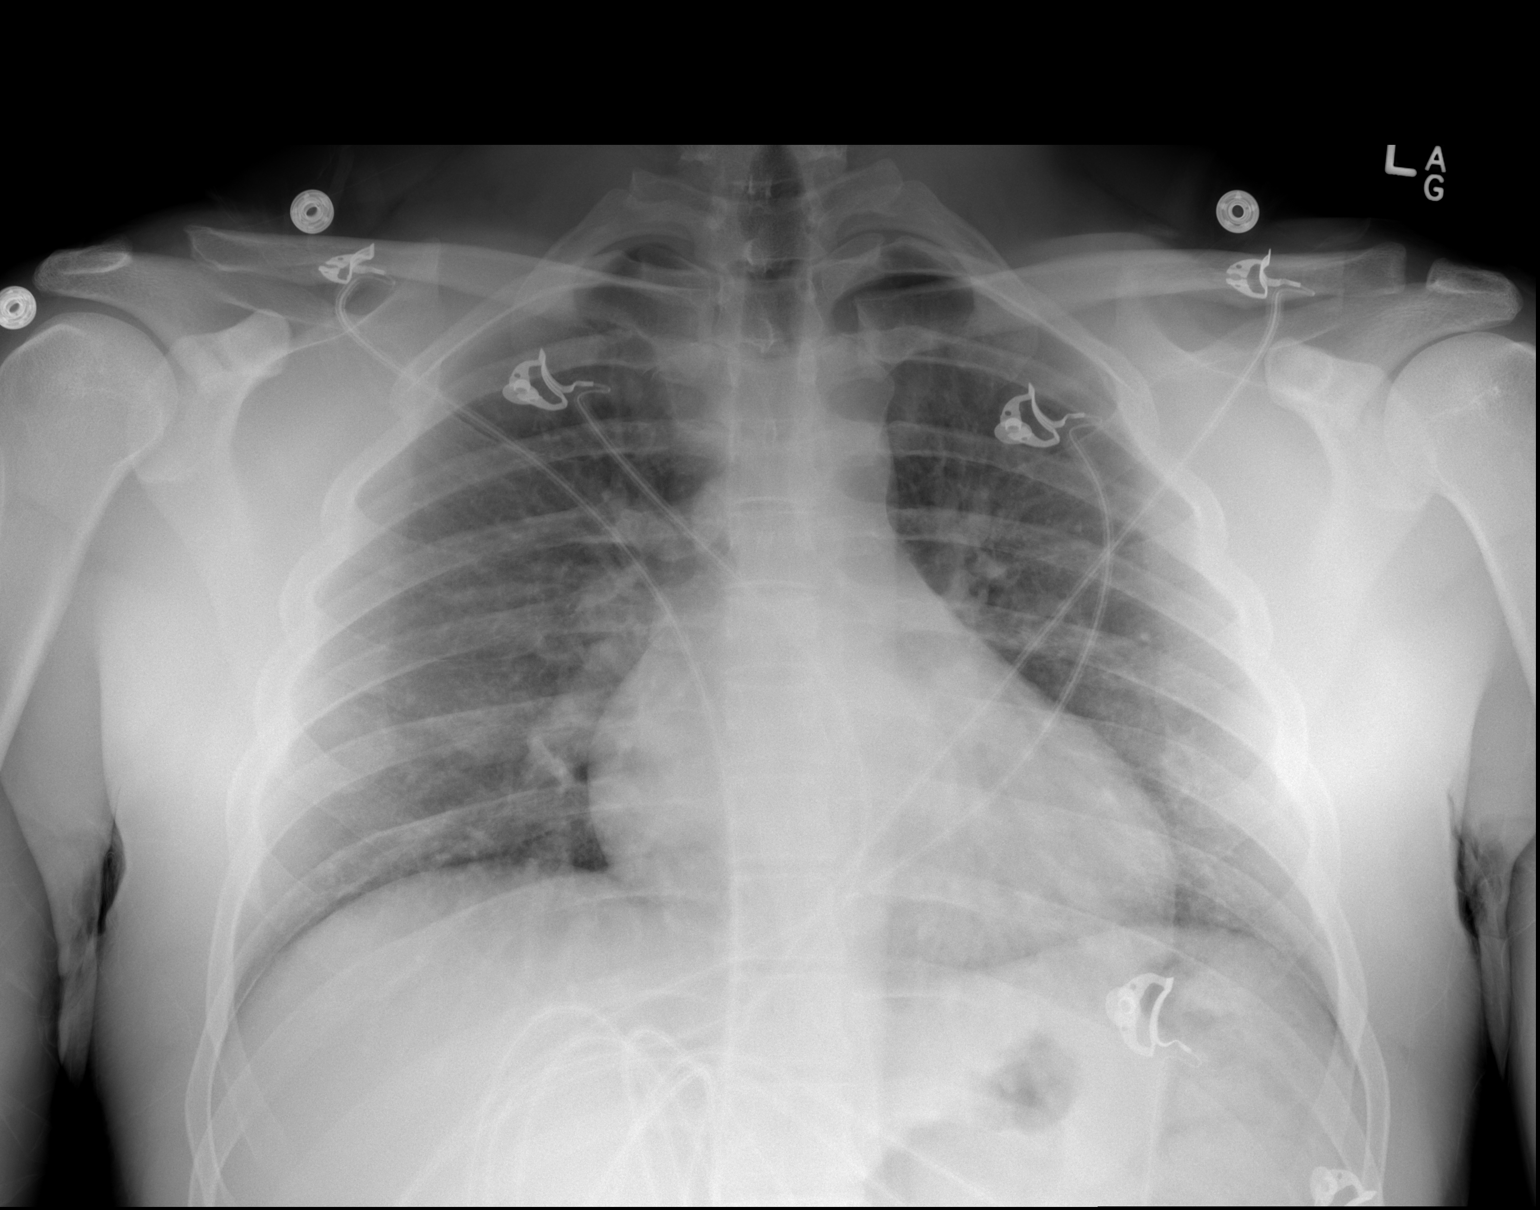

[2 of 2 positions shown; findings below may reference images not displayed]

FINDINGS: Upper normal size of cardiac silhouette for technique.

Mediastinal contours and pulmonary vascularity normal.

Lungs clear.

No pleural effusion or pneumothorax.

Prior resection versus resorption of distal RIGHT clavicle.

Osseous structures otherwise unremarkable.
IMPRESSION: No acute abnormalities.

## 2016-01-14 IMAGING — CT CT HEAD W/O CM
2 series · 17 of 30 positions shown, 20 images · non-contrast
Comparison: None.

CLINICAL DATA: 23-year-old male with new onset seizure.

EXAM:
CT HEAD WITHOUT CONTRAST
TECHNIQUE: Contiguous axial images were obtained from the base of the skull
through the vertex without intravenous contrast.

[Series 2: head w/o · axial · non-contrast · 0.43mm/px · z∈[+1795,+1915]mm · 9 of 31 slices shown, 12 images]
[im 4/31  brain]
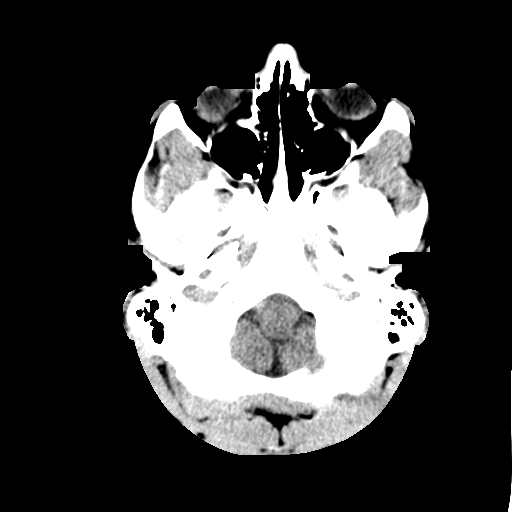
[im 4/31  bone]
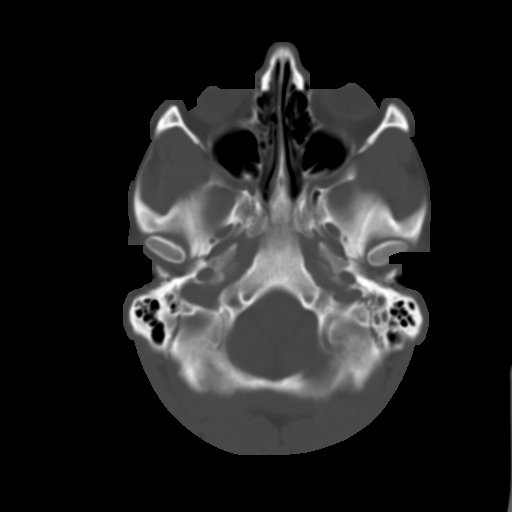
[im 7/31  brain]
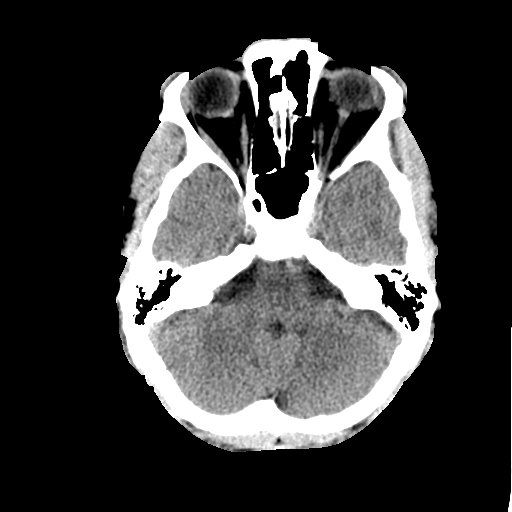
[im 10/31  brain]
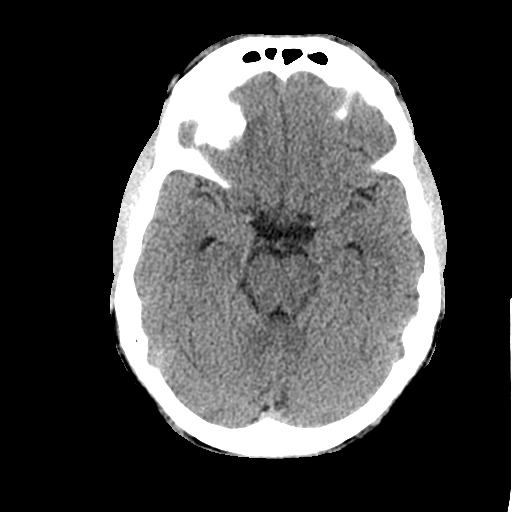
[im 13/31  brain]
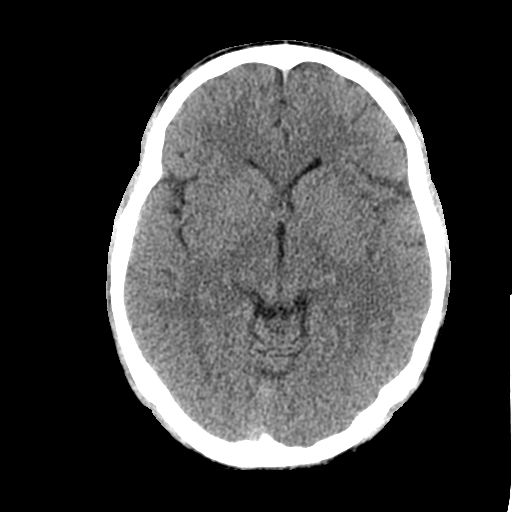
[im 16/31  brain]
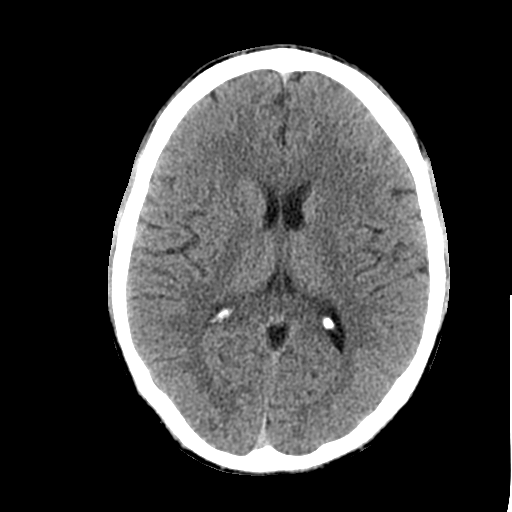
[im 16/31  bone]
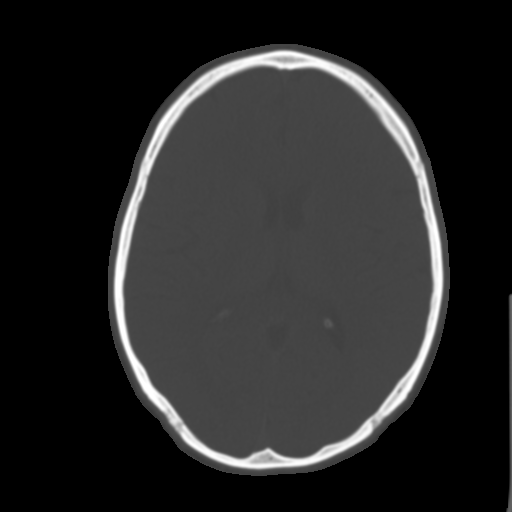
[im 19/31  brain]
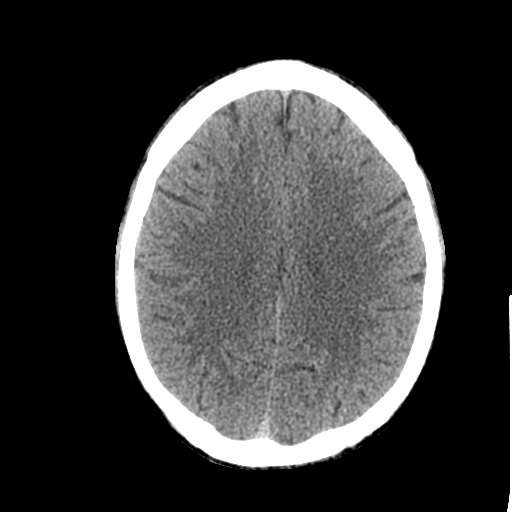
[im 22/31  brain]
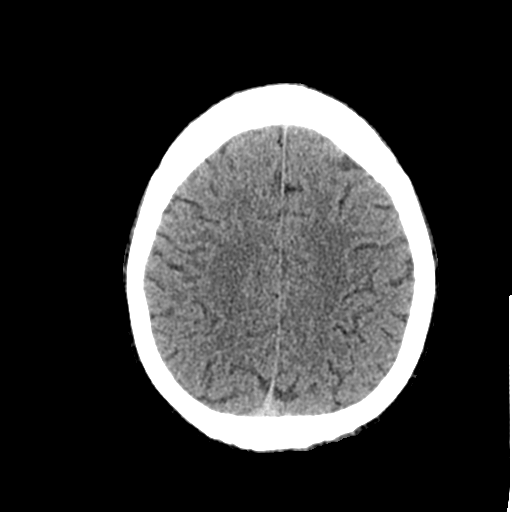
[im 25/31  brain]
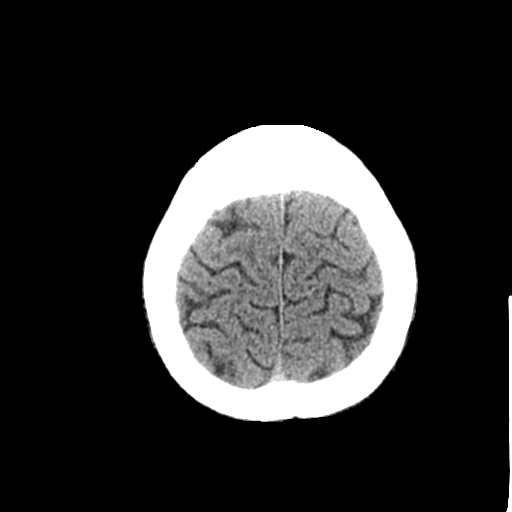
[im 28/31  brain]
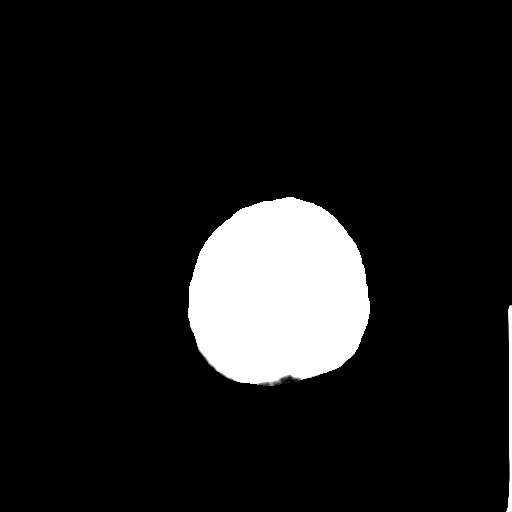
[im 28/31  bone]
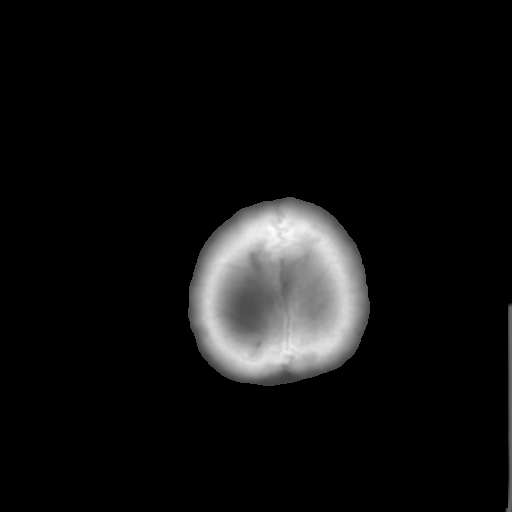

[Series 3: bone windows · axial · 0.43mm/px · z∈[+1795,+1912]mm · 8 of 51 slices shown]
[im 6/51  bone]
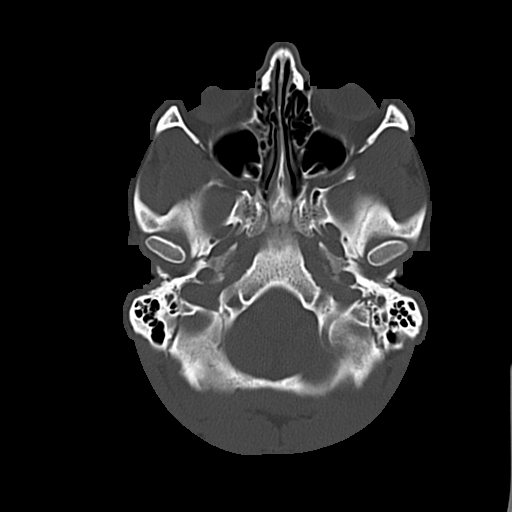
[im 12/51  bone]
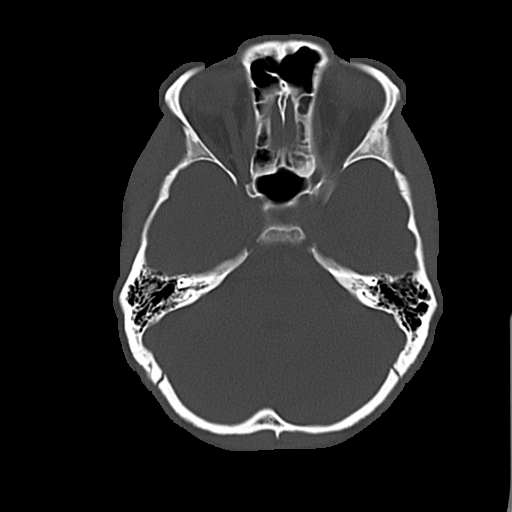
[im 17/51  bone]
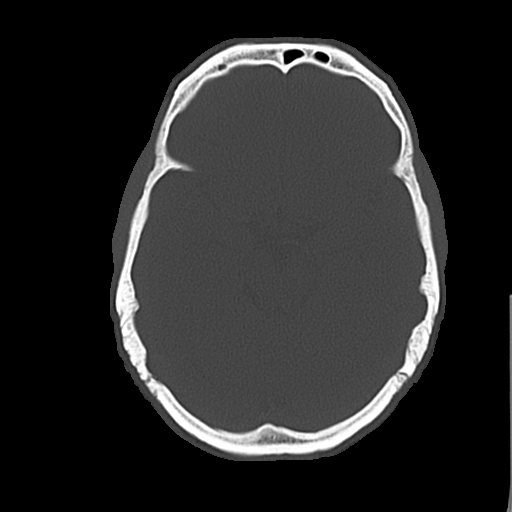
[im 23/51  bone]
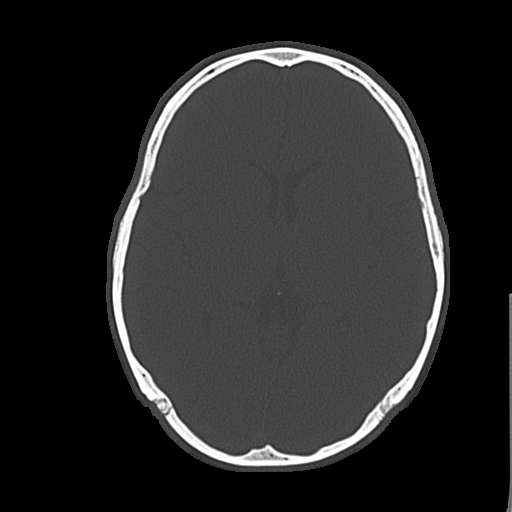
[im 28/51  bone]
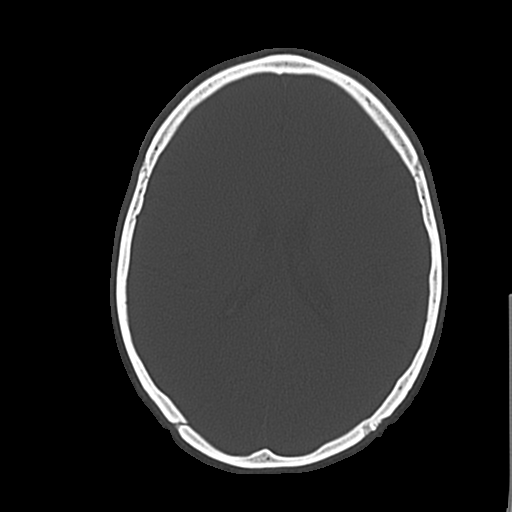
[im 34/51  bone]
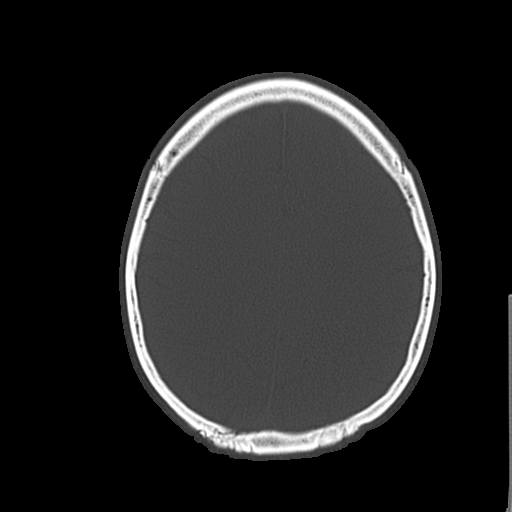
[im 39/51  bone]
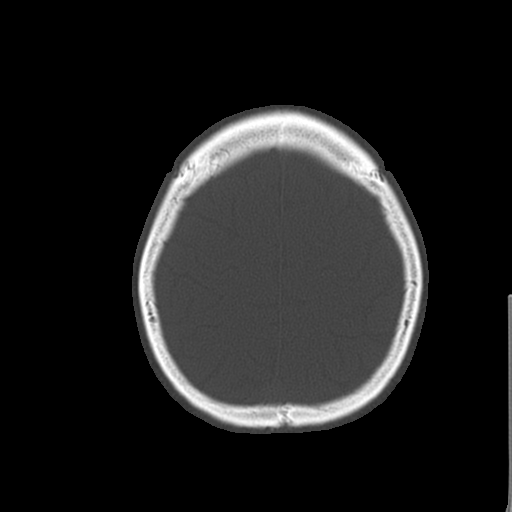
[im 45/51  bone]
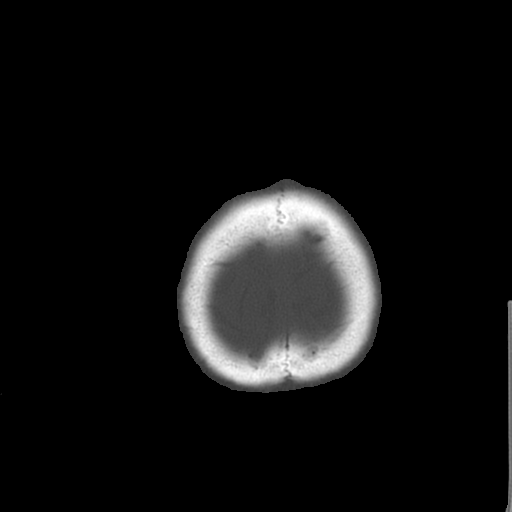

[17 of 30 positions shown; findings below may reference images not displayed]

FINDINGS: No intracranial hemorrhage, mass effect, or midline shift. No
hydrocephalus. The basilar cisterns are patent. No evidence of
territorial infarct. No intracranial fluid collection. Calvarium is
intact. Included paranasal sinuses and mastoid air cells are well
aerated.
IMPRESSION: No acute intracranial abnormality.

## 2017-12-19 ENCOUNTER — Encounter: Payer: Self-pay | Admitting: Emergency Medicine

## 2017-12-19 ENCOUNTER — Emergency Department
Admission: EM | Admit: 2017-12-19 | Discharge: 2017-12-19 | Disposition: A | Discharge status: Home / Self Care | Payer: BLUE CROSS/BLUE SHIELD | Attending: Emergency Medicine | Admitting: Emergency Medicine

## 2017-12-19 ENCOUNTER — Other Ambulatory Visit: Payer: Self-pay

## 2017-12-19 DIAGNOSIS — M62830 Muscle spasm of back: Secondary | ICD-10-CM

## 2017-12-19 DIAGNOSIS — M549 Dorsalgia, unspecified: Secondary | ICD-10-CM

## 2017-12-19 DIAGNOSIS — F17210 Nicotine dependence, cigarettes, uncomplicated: Secondary | ICD-10-CM | POA: Insufficient documentation

## 2017-12-19 DIAGNOSIS — Z79899 Other long term (current) drug therapy: Secondary | ICD-10-CM | POA: Insufficient documentation

## 2017-12-19 MED ORDER — HYDROMORPHONE HCL 1 MG/ML IJ SOLN
1.0000 mg | Freq: Once | INTRAMUSCULAR | Status: AC
  Administered 2017-12-19: 1 mg via INTRAVENOUS
  Filled 2017-12-19: qty 1

## 2017-12-19 MED ORDER — OXYCODONE-ACETAMINOPHEN 5-325 MG PO TABS: 1.0000 | ORAL_TABLET | ORAL | 0 refills | Status: AC | PRN

## 2017-12-19 MED ORDER — PREDNISONE 10 MG PO TABS: 10.0000 mg | ORAL_TABLET | Freq: Every day | ORAL | 0 refills | Status: AC

## 2017-12-19 NOTE — ED Provider Notes (Addendum)
[facility] Emergency Department Provider Note  Time seen: 5:54 PM  I have reviewed the triage vital signs and the nursing notes.   HISTORY  Chief Complaint Back Pain    HPI Steve Whitney is a 27 y.o. male with a past medical history of seizure disorder presents to the emergency department for back pain. According to the patient on 12/05/17 he fell from a third story height at a construction site. Patient states proximal May 1 week later 12/12/17 he had x-rays performed ordered by his primary care doctor which showed an L1 compression fracture. Patient states he has been slowly improving however today he was at a construction site sweeping, states halfway through sweeping he twisted around and felt severe pain in his mid back. Patient states 10/10 severe sharp pain to this area. Denies any other falls. Denies numbness in the legs denies incontinence.  Past Medical History:  Diagnosis Date  . Seizures [facility](HCC)     Patient Active Problem List   Diagnosis Date Noted  . Depression 03/17/2015  . Anxiety state 03/17/2015  . Non-compliance with treatment 03/17/2015  . Seizures (HCC) 11/14/2014    Past Surgical History:  Procedure Laterality Date  . SHOULDER SURGERY Right   . WISDOM TOOTH EXTRACTION      Prior to Admission medications   Medication Sig Start Date End Date Taking? Authorizing Provider  ALPRAZolam (XANAX) 0.25 MG tablet Take 0.25 mg by mouth daily. 12/16/14   [provider]  lamoTRIgine (LAMICTAL) 25 MG tablet Take 4 tablets (100 mg total) by mouth 2 (two) times daily. As directed. 03/17/15   Anson FretAhern, Antonia B, MD  Multiple Vitamin (MULTIVITAMIN WITH MINERALS) TABS tablet Take 1 tablet by mouth daily.    [provider]  traZODone (DESYREL) 50 MG tablet Take 50 mg by mouth daily. 01/21/15   [provider]    No Known Allergies  Family History  Problem Relation Age of Onset  . Lung cancer Unknown   . Stroke Unknown   .  Seizures Neg Hx     Social History Social History   Tobacco Use  . Smoking status: Current Every Day Smoker    Packs/day: 0.50    Types: Cigarettes  . Smokeless tobacco: Never Used  Substance Use Topics  . Alcohol use: Yes    Alcohol/week: 0.0 oz    Comment: once or twice a week  . Drug use: No    Review of Systems Constitutional: Negative for fever. Eyes: Negative for visual complaints ENT: Negative for recent illness/congestion Cardiovascular: Negative for chest pain. Respiratory: Negative for shortness of breath. Gastrointestinal: Negative for abdominal pain. Positive for nausea. Genitourinary: Negative for urinary compaints Musculoskeletal: Positive for mid back pain. Skin: Negative for skin complaints  Neurological: Negative for headache All other ROS negative  ____________________________________________   PHYSICAL EXAM:  VITAL SIGNS: ED Triage Vitals  Enc Vitals Group     BP 12/19/17 1649 (!) 144/91     Pulse Rate 12/19/17 1649 (!) 158     Resp 12/19/17 1649 (!) 24     Temp 12/19/17 1649 98.1 F (36.7 C)     Temp Source 12/19/17 1649 Oral     SpO2 12/19/17 1649 98 %     Weight 12/19/17 1651 195 lb (88.5 kg)     Height 12/19/17 1651 5\' 6"  (1.676 m)     Head Circumference --      Peak Flow --      Pain Score 12/19/17  1651 10     Pain Loc --      Pain Edu? --      Excl. in GC? --     Constitutional: Alert and oriented.  Mild distress due to back pain Eyes: Normal exam ENT   Head: Normocephalic and atraumatic.   Mouth/Throat: Mucous membranes are moist. Cardiovascular: Normal rate, regular rhythm.  Respiratory: Normal respiratory effort without tachypnea nor retractions. Breath sounds are clear  Gastrointestinal: Soft and nontender. No distention.  Musculoskeletal: Moderate tenderness to palpation of the mid T-spine right paraspinal muscles especially around the right scapula.  Mild midline tenderness around L1, no other midline tenderness  identified in the spine. Neurologic:  Normal speech and language. No gross focal neurologic deficits.  No signs of lower extremity neurological deficits. Skin:  Skin is warm, dry and intact.  Psychiatric: Mood and affect are normal. Speech and behavior are normal.   EKG reviewed and interpreted by myself shows sinus tachycardia 131 bpm with a narrow QRS, normal axis, normal intervals.  No significant ST changes. ____________________________________________   INITIAL IMPRESSION / ASSESSMENT AND PLAN / ED COURSE  Pertinent labs & imaging results that were available during my care of the patient were reviewed by me and considered in my medical decision making (see chart for details).  Patient presents to the emergency department with complaints of severe mid back pain.  Patient states he was sweeping when the back pain started.  Feels like his muscles are spasming in his back per patient.  Feels like something is out of line and needs to be popped per patient.  Differential would include muscle spasm less likely back fracture.  I reviewed the patient's records including his x-ray imaging 12/12/17.  Patient has a mild L1 compression fracture on those x-ray imaging with a negative T-spine and remainder of the L-spine is negative.  Patient's pain currently is in the right mid back at the level of the scapula.  Do not suspect worsening of his compression fracture.  I discussed with the patient that we should obtain CT imaging of the back to rule out worsening fracture disease.  Patient states he does not have insurance and is refusing CT imaging due to finances.  We will dose a dose of pain medication in the emergency department and continue to closely monitor.  Patient's pain he states is down to a 6/10 after pain medication.  Continues to be somewhat tachycardic around 115-120 bpm currently.  Denies any anterior chest pain.  No shortness of breath.  No pleuritic pain.  Patient is asking to be discharged  home.  I discussed with the patient continue monitoring in the emergency department with continued pain control given his continued tachycardia but patient wishes to go home.  Patient's symptoms are most suggestive of muscle spasm as they occurred during a nontraumatic event.  I again approached the issue of further imaging patient states he does not want any further imaging due to financial reasons.  I discussed very strict return precautions for any worsening pain, weakness numbness or incontinence, patient is agreeable to these return precautions.      ____________________________________________   FINAL CLINICAL IMPRESSION(S) / ED DIAGNOSES  Back pain    Minna Antis, MD 12/19/17 Velvet Bathe, MD 12/19/17 Vinnie Langton    Minna Antis, MD 12/19/17 2109

## 2017-12-19 NOTE — ED Triage Notes (Signed)
Pt in via POV with complaints of sudden onset back pain.  Pt with fall 3 weeks ago with known L1 compression fracture, pt has continued to work against MD order, now with complaints of thoracic back pain; states his job is very labor intensive, back began bothering him yesterday, worsening today with work.  Pt ambulatory but unable to get comfortable, increased discomfort with sitting.  Pt tachycardic, tearful in pain, other vitals WDL.

## 2017-12-19 NOTE — ED Notes (Signed)
Pt reports pain when twisting but otherwise is able to stand from bed and ambulate without difficulty. Pt in NAD at time of discharge.

## 2018-01-29 DEATH — deceased
